# Patient Record
Sex: Male | Born: 1995 | Race: White | Hispanic: No | Marital: Single | Smoking: Never smoker
Health system: Southern US, Community
[De-identification: ages and names within clinical notes are randomized; demographics above are authoritative.]

## PROBLEM LIST (undated history)

## (undated) DIAGNOSIS — J45909 Unspecified asthma, uncomplicated: Secondary | ICD-10-CM

---

## 2019-02-15 ENCOUNTER — Emergency Department (INDEPENDENT_AMBULATORY_CARE_PROVIDER_SITE_OTHER)
Admission: EM | Admit: 2019-02-15 | Discharge: 2019-02-15 | Disposition: A | Discharge status: Home / Self Care | Payer: BC Managed Care – PPO | Source: Home / Self Care

## 2019-02-15 ENCOUNTER — Emergency Department (INDEPENDENT_AMBULATORY_CARE_PROVIDER_SITE_OTHER): Payer: BC Managed Care – PPO

## 2019-02-15 ENCOUNTER — Encounter: Payer: Self-pay | Admitting: Emergency Medicine

## 2019-02-15 ENCOUNTER — Other Ambulatory Visit: Payer: Self-pay

## 2019-02-15 DIAGNOSIS — R05 Cough: Secondary | ICD-10-CM | POA: Diagnosis not present

## 2019-02-15 DIAGNOSIS — Z9189 Other specified personal risk factors, not elsewhere classified: Secondary | ICD-10-CM

## 2019-02-15 DIAGNOSIS — R0989 Other specified symptoms and signs involving the circulatory and respiratory systems: Secondary | ICD-10-CM

## 2019-02-15 DIAGNOSIS — J4521 Mild intermittent asthma with (acute) exacerbation: Secondary | ICD-10-CM

## 2019-02-15 DIAGNOSIS — R0602 Shortness of breath: Secondary | ICD-10-CM

## 2019-02-15 DIAGNOSIS — R059 Cough, unspecified: Secondary | ICD-10-CM

## 2019-02-15 HISTORY — DX: Unspecified asthma, uncomplicated: J45.909

## 2019-02-15 MED ORDER — PREDNISONE 20 MG PO TABS: 40.0000 mg | ORAL_TABLET | Freq: Every day | ORAL | 0 refills | Status: AC

## 2019-02-15 MED ORDER — BENZONATATE 100 MG PO CAPS: 100.0000 mg | ORAL_CAPSULE | Freq: Three times a day (TID) | ORAL | 0 refills | Status: AC | PRN

## 2019-02-15 NOTE — ED Provider Notes (Signed)
[facility]    CSN: [id number] Arrival date & time: 02/15/19  1047      History   Chief Complaint Chief Complaint  Patient presents with  . Shortness of Breath  . Cough    HPI Mike Williamson is a 24 y.o. male.   HPI  Patient with a history of asthma, chronic sinusitis, recurrent streptococcal infection present with 2 days of cough, shortness of breath.  Patient endorses use of nebulizer with albuterol a few times daily without significant relief of symptoms.  He has COVID-19 exposures 6 days ago. He is has been afebrile. Endorses chest tightness with deep inspiration and mild chest tightness while at rest.   Past Medical History:  Diagnosis Date  . Asthma     There are no problems to display for this patient.  History reviewed. No pertinent surgical history.     Home Medications    Prior to Admission medications   Not on File    Family History No family history on file.  Social History Social History   Tobacco Use  . Smoking status: Never Smoker  . Smokeless tobacco: Current User    Types: Chew  Substance Use Topics  . Alcohol use: Not on file  . Drug use: Not on file     Allergies   Cefazolin   Review of Systems Review of Systems Pertinent negatives listed in HPI Physical Exam Triage Vital Signs ED Triage Vitals  Enc Vitals Group     BP 02/15/19 1109 (!) 154/96     Pulse Rate 02/15/19 1109 80     Resp 02/15/19 1109 16     Temp 02/15/19 1109 98.8 F (37.1 C)     Temp Source 02/15/19 1109 Oral     SpO2 02/15/19 1109 97 %     Weight --      Height --      Head Circumference --      Peak Flow --      Pain Score 02/15/19 1106 3     Pain Loc --      Pain Edu? --      Excl. in GC? --    No data found.  Updated Vital Signs BP (!) 154/96   Pulse 80   Temp 98.8 F (37.1 C) (Oral)   Resp 16   SpO2 97%   Visual Acuity Right Eye Distance:   Left Eye Distance:   Bilateral Distance:    Right Eye Near:   Left Eye Near:      Bilateral Near:     Physical Exam Constitutional:      General: He is not in acute distress.    Appearance: He is ill-appearing. He is not toxic-appearing.  HENT:     Head: Normocephalic.  Cardiovascular:     Rate and Rhythm: Normal rate and regular rhythm.  Pulmonary:     Effort: No accessory muscle usage.     Breath sounds: Decreased air movement present. Examination of the right-upper field reveals wheezing. Examination of the left-upper field reveals wheezing. Wheezing present. No rhonchi or rales.  Musculoskeletal:     Cervical back: Normal range of motion.  Lymphadenopathy:     Cervical: No cervical adenopathy.  Skin:    General: Skin is warm and dry.  Neurological:     General: No focal deficit present.  Psychiatric:        Mood and Affect: Mood normal.      UC Treatments / Results  Labs (  all labs ordered are listed, but only abnormal results are displayed) Labs Reviewed  NOVEL CORONAVIRUS, NAA    EKG   Radiology No results found.  Procedures Procedures (including critical care time)  Medications Ordered in UC Medications - No data to display  Initial Impression / Assessment and Plan / UC Course  I have reviewed the triage vital signs and the nursing notes.  Pertinent labs & imaging results that were available during my care of the patient were reviewed by me and considered in my medical decision making (see chart for details).   Treating for an acute asthma exacerbation.  Chest x-ray reassuring.  Patient consented to a Covid test as he was exposed approximately 6 days ago to a coworker who was Covid positive.  He has remained afebrile and is currently experiencing as he symptoms are consistent with his prior  asthma related symptoms.  He has an albuterol inhaler and also a home neb machine.  Advised to continue and use of albuterol inhaler should be 2 puffs every 4-6 hours.  Will start on a burst of prednisone 40 mg once daily for 5 days.  Benzonatate  Perles prescribed 3 times daily as needed for cough.  Strict return precautions if symptoms worsen or do not improve.  Covid education provided. .Your COVID 19 results will be available in 48-72 hours. Negative results are immediately resulted to Mychart. All positive results are communicated with a phone call from our office.  Final Clinical Impressions(s) / UC Diagnoses   Final diagnoses:  Cough  Exacerbation of intermittent asthma, unspecified asthma severity  At increased risk of exposure to COVID-19 virus     Discharge Instructions     Chest x-ray results are normal today.  Start prednisone 40 mg with breakfast x5 days.  Continue albuterol inhaler 2 to 4 puffs daily.  I have also prescribed benzonatate Perles 100 to 200 mg take 3 times daily as needed for cough.  Your COVID 19 results will be available in 48-72 hours. Negative results are immediately resulted to Mychart. All positive results are communicated with a phone call from our office.     ED Prescriptions    Medication Sig Dispense Auth. Provider   predniSONE (DELTASONE) 20 MG tablet Take 2 tablets (40 mg total) by mouth daily with breakfast for 5 days. 10 tablet Scot Jun, FNP   benzonatate (TESSALON) 100 MG capsule Take 1-2 capsules (100-200 mg total) by mouth 3 (three) times daily as needed for cough. 40 capsule Scot Jun, FNP     PDMP not reviewed this encounter.   Scot Jun, [place] 02/17/19 [phone]

## 2019-02-15 NOTE — ED Triage Notes (Signed)
History of asthma. PT used 2 nebulizers yesterday. He has shortness of breath, cough, and chest discomfort for 2 days.

## 2019-02-15 NOTE — Discharge Instructions (Addendum)
Chest x-ray results are normal today.  Start prednisone 40 mg with breakfast x5 days.  Continue albuterol inhaler 2 to 4 puffs daily.  I have also prescribed benzonatate Perles 100 to 200 mg take 3 times daily as needed for cough.  Your COVID 19 results will be available in 48-72 hours. Negative results are immediately resulted to Mychart. All positive results are communicated with a phone call from our office.

## 2019-02-17 LAB — NOVEL CORONAVIRUS, NAA: SARS-CoV-2, NAA: NOT DETECTED

## 2019-09-10 ENCOUNTER — Other Ambulatory Visit: Payer: Self-pay

## 2019-09-10 ENCOUNTER — Emergency Department
Admission: EM | Admit: 2019-09-10 | Discharge: 2019-09-10 | Disposition: A | Discharge status: Home / Self Care | Payer: BC Managed Care – PPO | Source: Home / Self Care

## 2019-09-10 DIAGNOSIS — R0602 Shortness of breath: Secondary | ICD-10-CM

## 2019-09-10 DIAGNOSIS — Z8709 Personal history of other diseases of the respiratory system: Secondary | ICD-10-CM

## 2019-09-10 DIAGNOSIS — J029 Acute pharyngitis, unspecified: Secondary | ICD-10-CM

## 2019-09-10 DIAGNOSIS — J069 Acute upper respiratory infection, unspecified: Secondary | ICD-10-CM | POA: Diagnosis not present

## 2019-09-10 DIAGNOSIS — Z760 Encounter for issue of repeat prescription: Secondary | ICD-10-CM

## 2019-09-10 MED ORDER — PREDNISONE 20 MG PO TABS: ORAL_TABLET | ORAL | 0 refills | Status: AC

## 2019-09-10 MED ORDER — ALBUTEROL SULFATE HFA 108 (90 BASE) MCG/ACT IN AERS: 1.0000 | INHALATION_SPRAY | Freq: Four times a day (QID) | RESPIRATORY_TRACT | 0 refills | Status: AC | PRN

## 2019-09-10 NOTE — Discharge Instructions (Signed)
  You may take 500mg acetaminophen every 4-6 hours or in combination with ibuprofen 400-600mg every 6-8 hours as needed for pain, inflammation, and fever.  Be sure to well hydrated with clear liquids and get at least 8 hours of sleep at night, preferably more while sick.   Please follow up with family medicine in 1 week if needed.  Due to concern for possibly having Covid-19, it is advised that you self-isolate at home until test results come back, usually 2-3 days.  If positive, it is recommended you stay isolated for at least 10 days after symptom onset and 24 hours after last fever without taking medication (whichever is longer).  If you MUST go out, please wear a mask at all times, limit contact with others.   If your test is negative, you still have plenty of time to get the Covid vaccine. It is recommended you schedule an appointment to get your vaccine once you get over this current illness.  Please ask your primary care provider about any questions/concerns related to the vaccine.   

## 2019-09-10 NOTE — ED Provider Notes (Signed)
[name]    CSN: [id number] Arrival date & time: 09/10/19  1218      History   Chief Complaint Chief Complaint  Patient presents with  . Shortness of Breath  . Sore Throat    HPI Mike Williamson is a 24 y.o. male.   HPI Mike Williamson is a 24 y.o. male presenting to UC with c/o 4 days of cough, congestion, mild SOB, and sore throat. Hx of asthma, he has used his inhaler with temporary relief but is now out.  Denies fever, chills, n/v/d. His girlfriend was sick recently, tested negative for Covid at [facility]. His older brother is also in UC today with similar symptoms. Neither has received the Covid-19 vaccine.   Past Medical History:  Diagnosis Date  . Asthma     There are no problems to display for this patient.   History reviewed. No pertinent surgical history.     Home Medications    Prior to Admission medications   Medication Sig Start Date End Date Taking? Authorizing Provider  albuterol (VENTOLIN HFA) 108 (90 Base) MCG/ACT inhaler Inhale 1-2 puffs into the lungs every 6 (six) hours as needed for wheezing or shortness of breath. 09/10/19   Lurene Shadow, PA-C  benzonatate (TESSALON) 100 MG capsule Take 1-2 capsules (100-200 mg total) by mouth 3 (three) times daily as needed for cough. 02/15/19   Bing Neighbors, FNP  predniSONE (DELTASONE) 20 MG tablet 3 tabs po day one, then 2 po daily x 4 days 09/10/19   Lurene Shadow, PA-C    Family History Family History  Problem Relation Age of Onset  . Healthy Mother   . Healthy Father     Social History Social History   Tobacco Use  . Smoking status: Never Smoker  . Smokeless tobacco: Current User    Types: Chew  Vaping Use  . Vaping Use: Never used  Substance Use Topics  . Alcohol use: Yes    Comment: socially/ weekly  . Drug use: Not on file     Allergies   Cefazolin   Review of Systems Review of Systems  Constitutional: Negative for chills and fever.  HENT: Positive for congestion and  sore throat. Negative for ear pain, trouble swallowing and voice change.   Respiratory: Positive for cough and shortness of breath.   Cardiovascular: Negative for chest pain and palpitations.  Gastrointestinal: Negative for abdominal pain, diarrhea, nausea and vomiting.  Musculoskeletal: Negative for arthralgias, back pain and myalgias.  Skin: Negative for rash.  Neurological: Positive for headaches. Negative for dizziness and light-headedness.  All other systems reviewed and are negative.    Physical Exam Triage Vital Signs ED Triage Vitals  Enc Vitals Group     BP 09/10/19 1253 (!) 145/86     Pulse Rate 09/10/19 1253 80     Resp 09/10/19 1253 16     Temp 09/10/19 1253 98.7 F (37.1 C)     Temp Source 09/10/19 1253 Oral     SpO2 09/10/19 1253 99 %     Weight --      Height --      Head Circumference --      Peak Flow --      Pain Score 09/10/19 1251 5     Pain Loc --      Pain Edu? --      Excl. in GC? --    No data found.  Updated Vital Signs BP (!) 145/86 (BP Location:  Right Arm)   Pulse 80   Temp 98.7 F (37.1 C) (Oral)   Resp 16   SpO2 99%   Visual Acuity Right Eye Distance:   Left Eye Distance:   Bilateral Distance:    Right Eye Near:   Left Eye Near:    Bilateral Near:     Physical Exam Vitals and nursing note reviewed.  Constitutional:      General: He is not in acute distress.    Appearance: He is well-developed. He is not ill-appearing, toxic-appearing or diaphoretic.  HENT:     Head: Normocephalic and atraumatic.     Right Ear: Tympanic membrane and ear canal normal.     Left Ear: Tympanic membrane and ear canal normal.     Nose: Nose normal.     Right Sinus: No maxillary sinus tenderness or frontal sinus tenderness.     Left Sinus: No maxillary sinus tenderness or frontal sinus tenderness.     Mouth/Throat:     Lips: Pink.     Mouth: Mucous membranes are moist.     Pharynx: Oropharynx is clear. Uvula midline. No pharyngeal swelling,  oropharyngeal exudate, posterior oropharyngeal erythema or uvula swelling.  Cardiovascular:     Rate and Rhythm: Normal rate.  Pulmonary:     Effort: Pulmonary effort is normal.     Breath sounds: Normal breath sounds. No decreased breath sounds, wheezing, rhonchi or rales.  Musculoskeletal:        General: Normal range of motion.     Cervical back: Normal range of motion.  Skin:    General: Skin is warm and dry.  Neurological:     Mental Status: He is alert and oriented to person, place, and time.  Psychiatric:        Behavior: Behavior normal.      UC Treatments / Results  Labs (all labs ordered are listed, but only abnormal results are displayed) Labs Reviewed  NOVEL CORONAVIRUS, NAA    EKG   Radiology No results found.  Procedures Procedures (including critical care time)  Medications Ordered in UC Medications - No data to display  Initial Impression / Assessment and Plan / UC Course  I have reviewed the triage vital signs and the nursing notes.  Pertinent labs & imaging results that were available during my care of the patient were reviewed by me and considered in my medical decision making (see chart for details).     Hx and exam c/w viral illness Lungs: CTAB, O2 Sat 99% on RA No respiratory distress Covid-19 test: pending F/u with PCP Discussed symptoms that warrant emergent care in the ED. AVS given  Final Clinical Impressions(s) / UC Diagnoses   Final diagnoses:  Viral URI with cough  Sore throat  Medication refill  History of asthma  Shortness of breath     Discharge Instructions      You may take 500mg  acetaminophen every 4-6 hours or in combination with ibuprofen 400-600mg  every 6-8 hours as needed for pain, inflammation, and fever.  Be sure to well hydrated with clear liquids and get at least 8 hours of sleep at night, preferably more while sick.   Please follow up with family medicine in 1 week if needed.  Due to concern for  possibly having Covid-19, it is advised that you self-isolate at home until test results come back, usually 2-3 days.  If positive, it is recommended you stay isolated for at least 10 days after symptom onset and 24 hours after  last fever without taking medication (whichever is longer).  If you MUST go out, please wear a mask at all times, limit contact with others.   If your test is negative, you still have plenty of time to get the Covid vaccine. It is recommended you schedule an appointment to get your vaccine once you get over this current illness.  Please ask your primary care provider about any questions/concerns related to the vaccine.      ED Prescriptions    Medication Sig Dispense Auth. Provider   predniSONE (DELTASONE) 20 MG tablet 3 tabs po day one, then 2 po daily x 4 days 11 tablet Depaul Arizpe O, PA-C   albuterol (VENTOLIN HFA) 108 (90 Base) MCG/ACT inhaler Inhale 1-2 puffs into the lungs every 6 (six) hours as needed for wheezing or shortness of breath. 18 g Lurene Shadow, PA-C     PDMP not reviewed this encounter.   Lurene Shadow, [place] 09/10/19 1332

## 2019-09-10 NOTE — ED Triage Notes (Signed)
Patient presents to Urgent Care with complaints of shortness of breath and sore throat since 4 days ago. Patient reports he has been using his inhaler but now it is gone, would like a refill. Pt in NAD during triage.

## 2019-09-12 LAB — NOVEL CORONAVIRUS, NAA: SARS-CoV-2, NAA: NOT DETECTED

## 2019-09-12 LAB — SARS-COV-2, NAA 2 DAY TAT

## 2020-02-20 ENCOUNTER — Emergency Department: Admit: 2020-02-20 | Discharge status: Absent without leave | Payer: Self-pay

## 2020-02-20 ENCOUNTER — Emergency Department
Admission: EM | Admit: 2020-02-20 | Discharge: 2020-02-20 | Disposition: A | Discharge status: Home / Self Care | Payer: BC Managed Care – PPO | Source: Home / Self Care

## 2020-02-20 ENCOUNTER — Emergency Department (INDEPENDENT_AMBULATORY_CARE_PROVIDER_SITE_OTHER): Payer: BC Managed Care – PPO

## 2020-02-20 DIAGNOSIS — W010XXA Fall on same level from slipping, tripping and stumbling without subsequent striking against object, initial encounter: Secondary | ICD-10-CM

## 2020-02-20 DIAGNOSIS — S299XXA Unspecified injury of thorax, initial encounter: Secondary | ICD-10-CM | POA: Diagnosis not present

## 2020-02-20 DIAGNOSIS — R0781 Pleurodynia: Secondary | ICD-10-CM

## 2020-02-20 MED ORDER — DICLOFENAC SODIUM 50 MG PO TBEC: 50.0000 mg | DELAYED_RELEASE_TABLET | Freq: Two times a day (BID) | ORAL | 0 refills | Status: AC

## 2020-02-20 MED ORDER — CYCLOBENZAPRINE HCL 5 MG PO TABS: 10.0000 mg | ORAL_TABLET | Freq: Two times a day (BID) | ORAL | 0 refills | Status: AC | PRN
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
